# Patient Record
Sex: Female | Born: 1950 | Race: Black or African American | Hispanic: No | Smoking: Never smoker
Health system: Southern US, Community
[De-identification: ages and names within clinical notes are randomized; demographics above are authoritative.]

## PROBLEM LIST (undated history)

## (undated) DIAGNOSIS — I10 Essential (primary) hypertension: Secondary | ICD-10-CM

## (undated) HISTORY — PX: TUBAL LIGATION: SHX77

---

## 2017-05-20 ENCOUNTER — Emergency Department (HOSPITAL_COMMUNITY)
Admission: EM | Admit: 2017-05-20 | Discharge: 2017-05-20 | Disposition: A | Discharge status: Home / Self Care | Payer: Medicare Other | Attending: Emergency Medicine | Admitting: Emergency Medicine

## 2017-05-20 ENCOUNTER — Encounter (HOSPITAL_COMMUNITY): Payer: Self-pay | Admitting: Emergency Medicine

## 2017-05-20 ENCOUNTER — Emergency Department (HOSPITAL_COMMUNITY): Payer: Medicare Other

## 2017-05-20 DIAGNOSIS — Y999 Unspecified external cause status: Secondary | ICD-10-CM | POA: Diagnosis not present

## 2017-05-20 DIAGNOSIS — M79673 Pain in unspecified foot: Secondary | ICD-10-CM

## 2017-05-20 DIAGNOSIS — M7918 Myalgia, other site: Secondary | ICD-10-CM

## 2017-05-20 DIAGNOSIS — Y92410 Unspecified street and highway as the place of occurrence of the external cause: Secondary | ICD-10-CM | POA: Diagnosis not present

## 2017-05-20 DIAGNOSIS — I10 Essential (primary) hypertension: Secondary | ICD-10-CM | POA: Diagnosis not present

## 2017-05-20 DIAGNOSIS — Y9389 Activity, other specified: Secondary | ICD-10-CM | POA: Insufficient documentation

## 2017-05-20 DIAGNOSIS — M79671 Pain in right foot: Secondary | ICD-10-CM | POA: Insufficient documentation

## 2017-05-20 HISTORY — DX: Essential (primary) hypertension: I10

## 2017-05-20 MED ORDER — IBUPROFEN 400 MG PO TABS: 400.0000 mg | ORAL_TABLET | Freq: Four times a day (QID) | ORAL | 0 refills | Status: AC | PRN

## 2017-05-20 MED ORDER — NAPROXEN 250 MG PO TABS
500.0000 mg | ORAL_TABLET | Freq: Once | ORAL | Status: AC
  Administered 2017-05-20: 500 mg via ORAL
  Filled 2017-05-20: qty 2

## 2017-05-20 MED ORDER — ACETAMINOPHEN 325 MG PO TABS: 650.0000 mg | ORAL_TABLET | Freq: Four times a day (QID) | ORAL | 0 refills | Status: AC | PRN
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
## 2017-05-20 NOTE — Discharge Instructions (Signed)
Please see your doctor in week.

## 2017-05-20 NOTE — ED Notes (Signed)
Ortho at bedside.
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
